# Patient Record
Sex: Male | Born: 1965 | Race: White | Hispanic: No | Marital: Married | Smoking: Never smoker
Health system: Southern US, Community
[De-identification: ages and names within clinical notes are randomized; demographics above are authoritative.]

## PROBLEM LIST (undated history)

## (undated) DIAGNOSIS — E785 Hyperlipidemia, unspecified: Secondary | ICD-10-CM

## (undated) DIAGNOSIS — I10 Essential (primary) hypertension: Secondary | ICD-10-CM

## (undated) HISTORY — DX: Essential (primary) hypertension: I10

## (undated) HISTORY — DX: Hyperlipidemia, unspecified: E78.5

---
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
## 2005-11-09 ENCOUNTER — Ambulatory Visit: Payer: Self-pay | Admitting: Sports Medicine

## 2005-12-17 ENCOUNTER — Ambulatory Visit: Payer: Self-pay | Admitting: Sports Medicine

## 2015-07-31 ENCOUNTER — Encounter: Payer: Self-pay | Admitting: Sports Medicine

## 2015-07-31 ENCOUNTER — Ambulatory Visit (INDEPENDENT_AMBULATORY_CARE_PROVIDER_SITE_OTHER): Payer: BLUE CROSS/BLUE SHIELD | Admitting: Sports Medicine

## 2015-07-31 VITALS — BP 175/89 | HR 76 | Ht 70.0 in | Wt 190.0 lb

## 2015-07-31 DIAGNOSIS — S73192A Other sprain of left hip, initial encounter: Secondary | ICD-10-CM | POA: Diagnosis not present

## 2015-07-31 DIAGNOSIS — M545 Low back pain, unspecified: Secondary | ICD-10-CM | POA: Insufficient documentation

## 2015-07-31 DIAGNOSIS — S76019A Strain of muscle, fascia and tendon of unspecified hip, initial encounter: Secondary | ICD-10-CM | POA: Insufficient documentation

## 2015-07-31 MED ORDER — MELOXICAM 15 MG PO TABS: 15.0000 mg | ORAL_TABLET | Freq: Every day | ORAL | Status: DC

## 2015-07-31 MED ORDER — CYCLOBENZAPRINE HCL 10 MG PO TABS: 10.0000 mg | ORAL_TABLET | Freq: Three times a day (TID) | ORAL | Status: DC | PRN

## 2015-07-31 NOTE — Assessment & Plan Note (Signed)
Givne basic 4 Williams flexion exercises  Icing or heat for comfort  Start flexeril at HS and Meloxicam 15 QD  Red flags advised  Reck if not resolving

## 2015-07-31 NOTE — Assessment & Plan Note (Signed)
Given a print out of hip abduction exercises  Demonstrated use of these  Expect good respone in 6 weeks and gradually RT running  Reck if not

## 2015-07-31 NOTE — Progress Notes (Signed)
Patient ID: Craig Oliver, male   DOB: Jul 08, 1965, 50 y.o.   MRN: [id number]  CC; acute RT LBP/ 2 mos of left hip pain  Patient awakened this morning Has had LBP near RT SIJ No specific injury No signiricant hx of LBP Does not radiate to leg Hurts to sit No bowel or bladder sxs  Left lateral hip pain has started about 2 mos ago He was running This has gotten worse to where he does not want to run Tried different shoes but no real difference No specific injury but was doing fitness exercises as well  Past Hx HTN on lisinopril Chol on lipitor  ROS Denies weakness in lower extremity No sciatica No joint pain or swelling  Gen Looks uncomfortable but NAD BP 175/89 mmHg  Pulse 76  Ht 5\' 10"  (1.778 m)  Wt 190 lb (86.183 kg)  BMI 27.26 kg/m2  Pain over RT SIJ but both are mobile Only minimal pain over lumbar spine Neg SLR Good strength in testing L3 to S 2 Heel, toe and tandem walk with no problem  Left hip shows norm ROM Flex and ext strength is good Hip abduction is weak over glut med TFL is weak Walking gait shifts to left

## 2015-09-11 ENCOUNTER — Encounter: Payer: Self-pay | Admitting: Sports Medicine

## 2015-09-11 ENCOUNTER — Ambulatory Visit (INDEPENDENT_AMBULATORY_CARE_PROVIDER_SITE_OTHER): Payer: BLUE CROSS/BLUE SHIELD | Admitting: Sports Medicine

## 2015-09-11 ENCOUNTER — Encounter (INDEPENDENT_AMBULATORY_CARE_PROVIDER_SITE_OTHER): Payer: Self-pay

## 2015-09-11 VITALS — BP 136/85 | Ht 70.0 in | Wt 190.0 lb

## 2015-09-11 DIAGNOSIS — M545 Low back pain: Secondary | ICD-10-CM

## 2015-09-11 DIAGNOSIS — S73192D Other sprain of left hip, subsequent encounter: Secondary | ICD-10-CM

## 2015-09-11 NOTE — Progress Notes (Signed)
Patient ID: Craig Oliver, male   DOB: January 10, 1966, 50 y.o.   MRN: [id number]  Patient returns for followup of low back pain and hip pain  Within 3 days of starting Flexeril and Mobic he was able to stop medicines for his back pain resolved He began doing the exercises for his left hip and that pain also has steadily lessened He rarely has pain Pain levels now are down to 1-2 He has been continued exercises and even did boot camp this week He is hoping to start back more running  Review of systems Denies cough or sneeze pain Denies any radiation down his leg No weakness in either extremity  Physical examination No acute distress BP 136/85 mmHg  Ht 5\' 10"  (1.778 m)  Wt 190 lb (86.183 kg)  BMI 27.26 kg/m2  Back motion is full in flexion extension lateral bending and rotation No pain with these motions He is fairly tight on forward flexion  Hip joint range of motion is normal laterally Abduction strength now has returned to normal FABER is tight on the right Straight leg raise is tight at 70 on the right and 60 on the left

## 2015-09-11 NOTE — Assessment & Plan Note (Signed)
This has essentially resolved  I think he should continue to do exercises at least 2-3 times a week to help improve his hip and core strength   Okay to ease back into running

## 2015-09-11 NOTE — Assessment & Plan Note (Signed)
He is much improved  Because he said a couple flares of back pain I think he should continue on some Williams flexion exercises  Continue on abdominal strengthening  Recheck when necessary

## 2015-09-11 NOTE — Patient Instructions (Signed)
For your low back: at least a couple times a week, do some knee to chest stretches and some sit-ups or crunches  For your hips:  Try to do standing rotation  For your hamstrings: try hurdler stretch after running and an extender exercise with 2 sets of 10 reps  See handouts  Follow up as needed

## 2015-11-27 ENCOUNTER — Ambulatory Visit (INDEPENDENT_AMBULATORY_CARE_PROVIDER_SITE_OTHER): Payer: BLUE CROSS/BLUE SHIELD | Admitting: Psychology

## 2015-11-27 DIAGNOSIS — F341 Dysthymic disorder: Secondary | ICD-10-CM

## 2015-12-11 ENCOUNTER — Ambulatory Visit (INDEPENDENT_AMBULATORY_CARE_PROVIDER_SITE_OTHER): Payer: BLUE CROSS/BLUE SHIELD | Admitting: Psychology

## 2015-12-11 DIAGNOSIS — F4320 Adjustment disorder, unspecified: Secondary | ICD-10-CM | POA: Diagnosis not present

## 2017-03-25 DIAGNOSIS — J069 Acute upper respiratory infection, unspecified: Secondary | ICD-10-CM | POA: Diagnosis not present

## 2017-12-22 DIAGNOSIS — R7303 Prediabetes: Secondary | ICD-10-CM | POA: Diagnosis not present

## 2017-12-22 DIAGNOSIS — Z0000 Encounter for general adult medical examination without abnormal findings: Secondary | ICD-10-CM | POA: Diagnosis not present

## 2017-12-22 DIAGNOSIS — E785 Hyperlipidemia, unspecified: Secondary | ICD-10-CM | POA: Diagnosis not present

## 2017-12-22 DIAGNOSIS — I10 Essential (primary) hypertension: Secondary | ICD-10-CM | POA: Diagnosis not present

## 2018-01-25 DIAGNOSIS — I10 Essential (primary) hypertension: Secondary | ICD-10-CM | POA: Diagnosis not present

## 2018-01-25 DIAGNOSIS — Z0189 Encounter for other specified special examinations: Secondary | ICD-10-CM | POA: Diagnosis not present

## 2018-01-25 DIAGNOSIS — E782 Mixed hyperlipidemia: Secondary | ICD-10-CM | POA: Diagnosis not present

## 2018-01-25 DIAGNOSIS — R6889 Other general symptoms and signs: Secondary | ICD-10-CM | POA: Diagnosis not present

## 2018-02-09 DIAGNOSIS — Z23 Encounter for immunization: Secondary | ICD-10-CM | POA: Diagnosis not present

## 2018-02-15 DIAGNOSIS — I10 Essential (primary) hypertension: Secondary | ICD-10-CM | POA: Diagnosis not present

## 2018-02-15 DIAGNOSIS — R6889 Other general symptoms and signs: Secondary | ICD-10-CM | POA: Diagnosis not present

## 2018-02-17 DIAGNOSIS — I10 Essential (primary) hypertension: Secondary | ICD-10-CM | POA: Diagnosis not present

## 2018-02-17 DIAGNOSIS — R6889 Other general symptoms and signs: Secondary | ICD-10-CM | POA: Diagnosis not present

## 2018-03-02 DIAGNOSIS — Z0189 Encounter for other specified special examinations: Secondary | ICD-10-CM | POA: Diagnosis not present

## 2018-03-02 DIAGNOSIS — I10 Essential (primary) hypertension: Secondary | ICD-10-CM | POA: Diagnosis not present

## 2018-03-02 DIAGNOSIS — E782 Mixed hyperlipidemia: Secondary | ICD-10-CM | POA: Diagnosis not present

## 2018-03-23 DIAGNOSIS — I10 Essential (primary) hypertension: Secondary | ICD-10-CM | POA: Diagnosis not present

## 2018-03-27 DIAGNOSIS — M25512 Pain in left shoulder: Secondary | ICD-10-CM | POA: Diagnosis not present

## 2018-04-21 ENCOUNTER — Ambulatory Visit
Admission: RE | Admit: 2018-04-21 | Discharge: 2018-04-21 | Disposition: A | Discharge status: Home / Self Care | Payer: BLUE CROSS/BLUE SHIELD | Source: Ambulatory Visit | Attending: Chiropractic Medicine | Admitting: Chiropractic Medicine

## 2018-04-21 ENCOUNTER — Other Ambulatory Visit: Payer: Self-pay | Admitting: Chiropractic Medicine

## 2018-04-21 DIAGNOSIS — M25512 Pain in left shoulder: Secondary | ICD-10-CM

## 2018-04-21 DIAGNOSIS — M25612 Stiffness of left shoulder, not elsewhere classified: Secondary | ICD-10-CM

## 2018-04-21 DIAGNOSIS — M19012 Primary osteoarthritis, left shoulder: Secondary | ICD-10-CM | POA: Diagnosis not present

## 2018-08-31 ENCOUNTER — Encounter: Payer: Self-pay | Admitting: Cardiology

## 2018-08-31 ENCOUNTER — Ambulatory Visit: Payer: BLUE CROSS/BLUE SHIELD | Admitting: Cardiology

## 2018-08-31 ENCOUNTER — Other Ambulatory Visit: Payer: Self-pay

## 2018-08-31 VITALS — BP 149/93 | HR 73 | Ht 70.0 in | Wt 192.0 lb

## 2018-08-31 DIAGNOSIS — I10 Essential (primary) hypertension: Secondary | ICD-10-CM

## 2018-08-31 NOTE — Progress Notes (Signed)
Virtual Visit via Video Note   Subjective:   Craig Oliver, male    DOB: [birth year], 53 y.o.   MRN: [id number]   I connected with the patient on 08/31/18 by a video enabled telemedicine application and verified that I am speaking with the correct person using two identifiers.     I discussed the limitations of evaluation and management by telemedicine and the availability of in person appointments. The patient expressed understanding and agreed to proceed.   This visit type was conducted due to national recommendations for restrictions regarding the COVID-19 Pandemic (e.g. social distancing).  This format is felt to be most appropriate for this patient at this time.  All issues noted in this document were discussed and addressed.  No physical exam was performed (except for noted visual exam findings with Tele health visits).  The patient has consented to conduct a Tele health visit and understands insurance will be billed.     Chief complaint:  Hypertension  HPI  53 y/o Caucasian male with hypertension, normal exercise treadmill stress test, incidental finding of possible PFO.  He has not had any major medical issues other than shoulder pain treated with physical therapy.  He has not been checking his blood pressure regularly.  While it was 130s over 80s last night, it was elevated this morning.  He is compliant with his medical therapy.  However, his exercise activity has reduced during current long-term.  Past Medical History:  Diagnosis Date  . Hyperlipidemia   . Hypertension      Past Surgical History:  Procedure Laterality Date  . FINGER SURGERY Left 1985     Social History   Socioeconomic History  . Marital status: Married    Spouse name: Not on file  . Number of children: 1  . Years of education: Not on file  . Highest education level: Not on file  Occupational History  . Not on file  Social Needs  . Financial resource strain: Not on file  . Food insecurity:     Worry: Not on file    Inability: Not on file  . Transportation needs:    Medical: Not on file    Non-medical: Not on file  Tobacco Use  . Smoking status: Never Smoker  . Smokeless tobacco: Never Used  Substance and Sexual Activity  . Alcohol use: Yes    Alcohol/week: 0.0 standard drinks    Comment: occasionally  . Drug use: Not on file  . Sexual activity: Not on file  Lifestyle  . Physical activity:    Days per week: Not on file    Minutes per session: Not on file  . Stress: Not on file  Relationships  . Social connections:    Talks on phone: Not on file    Gets together: Not on file    Attends religious service: Not on file    Active member of club or organization: Not on file    Attends meetings of clubs or organizations: Not on file    Relationship status: Not on file  . Intimate partner violence:    Fear of current or ex partner: Not on file    Emotionally abused: Not on file    Physically abused: Not on file    Forced sexual activity: Not on file  Other Topics Concern  . Not on file  Social History Narrative  . Not on file     Family History  Problem Relation Age of Onset  . Breast  cancer Mother     Current Outpatient Medications on File Prior to Visit  Medication Sig Dispense Refill  . atorvastatin (LIPITOR) 10 MG tablet daily.    Marland Kitchen docusate sodium (COLACE) 100 MG capsule Take 100 mg by mouth daily.    . folic acid (FOLVITE) 086 MCG tablet Take 800 mcg by mouth daily.    . hydrochlorothiazide (HYDRODIURIL) 25 MG tablet Take 25 mg by mouth daily.    Marland Kitchen losartan (COZAAR) 100 MG tablet Take 100 mg by mouth daily.    . Multiple Vitamins-Minerals (CENTRUM SILVER ADULT 50+ PO) Take by mouth daily.    . Omega-3 Fatty Acids (OMEGA-3 CF PO) Take by mouth daily. 2485mg     No current facility-administered medications on file prior to visit.     Cardiovascular studies:  EKG 01/25/2018: Sinus rhythm 60 bpm. Normal EKG.  Exercise Treadmill Stress Test  02/17/2018:  Indication: Reduced exercise capacity , HTN The patient exercised on Bruce protocol for  11:22 min. Patient achieved  13.48 METS and reached HR  169 bpm, which is  101 % of maximum age-predicted HR.  Stress test terminated due to fatigue.   Exercise capacity was normal. HR Response to Exercise: Appropriate. BP Response to Exercise: Resting hypertension 150/92 mmhg- exaggerated response, peak BP  252/70 mmHg. Chest Pain: none. Arrhythmias: none. Resting EKG demonstrates Normal sinus rhythm. ST Changes: With peak exercise there was no ST-T changes of ischemia.  Overall Impression: Normal stress test.  Echocardiogram 02/15/2018: Left ventricle cavity is normal in size. Mild concentric hypertrophy of the left ventricle. Normal global wall motion. Doppler evidence of grade I (impaired) diastolic dysfunction, normal LAP. Calculated EF 55%. 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 Mild (Grade I) aortic regurgitation. Inadequate tricuspid regurgitation jet to estimate pulmonary artery pressure. Normal right atrial pressure.  Recent labs: 12/22/2017: H/H 17/49. MCV 98. Platelets 193.  Glucose 108, BUN/Cr 17/0.96. eGFR 82/99. Na/K 139/4.7. Rest of the CMP normal. Chol 161, TG 132, HDL 49, LDL 86.  Review of Systems  Constitution: Negative for decreased appetite, malaise/fatigue, weight gain and weight loss.  HENT: Negative for congestion.   Eyes: Negative for visual disturbance.  Cardiovascular: Negative for chest pain, dyspnea on exertion, leg swelling, palpitations and syncope.  Respiratory: Negative for cough.   Endocrine: Negative for cold intolerance.  Hematologic/Lymphatic: Does not bruise/bleed easily.  Skin: Negative for itching and rash.  Musculoskeletal: Negative for myalgias.  Gastrointestinal: Negative for abdominal pain, nausea and vomiting.  Genitourinary: Negative for dysuria.  Neurological: Negative for dizziness and weakness.   Psychiatric/Behavioral: The patient is not nervous/anxious.   All other systems reviewed and are negative.        Vitals:   08/31/18 0947  BP: (!) 149/93  Pulse: 73   (Measured by the patient using a home BP monitor)   Observation/findings during video visit   Objective:    Physical Exam  Constitutional: He is oriented to person, place, and time. He appears well-developed and well-nourished. No distress.  Pulmonary/Chest: Effort normal.  Neurological: He is alert and oriented to person, place, and time.  Psychiatric: He has a normal mood and affect.  Nursing note and vitals reviewed.         Assessment & Recommendations:   53y/o Caucasian male with hypertension, normal exercise treadmill stress test, incidental finding of possiblw PFO.  Hypertension: Suboptimal control today.  Continue losartan 100 mg daily, hydrochlorothiazide 25 mg daily.  I have encouraged the patient to check his blood  pressure regularly.  If persistent elevation greater than 140/80 mmHg, recommend adding amlodipine 5 mg daily.  Recommend regular physical activity and salt restriction.  Continue follow-up with PCP.  I will see him on as-needed basis.  Primary prevention: Continue low dose statin  [name], MD [facility] Pager: [phone] Office: [phone] If no answer Cell [phone]

## 2018-09-25 ENCOUNTER — Telehealth: Payer: Self-pay

## 2018-09-25 NOTE — Telephone Encounter (Signed)
Patient called to say that his BP has been in the 130's over 80's.Marland Kitchen

## 2019-01-02 DIAGNOSIS — Z0000 Encounter for general adult medical examination without abnormal findings: Secondary | ICD-10-CM | POA: Diagnosis not present

## 2019-01-09 DIAGNOSIS — R7303 Prediabetes: Secondary | ICD-10-CM | POA: Diagnosis not present

## 2019-01-09 DIAGNOSIS — Z23 Encounter for immunization: Secondary | ICD-10-CM | POA: Diagnosis not present

## 2019-01-09 DIAGNOSIS — Z125 Encounter for screening for malignant neoplasm of prostate: Secondary | ICD-10-CM | POA: Diagnosis not present

## 2019-01-09 DIAGNOSIS — I10 Essential (primary) hypertension: Secondary | ICD-10-CM | POA: Diagnosis not present

## 2019-01-09 DIAGNOSIS — E785 Hyperlipidemia, unspecified: Secondary | ICD-10-CM | POA: Diagnosis not present

## 2019-06-18 ENCOUNTER — Other Ambulatory Visit: Payer: Self-pay

## 2019-06-18 MED ORDER — HYDROCHLOROTHIAZIDE 25 MG PO TABS: 25.0000 mg | ORAL_TABLET | Freq: Every day | ORAL | 1 refills | Status: DC

## 2019-06-18 MED ORDER — LOSARTAN POTASSIUM 100 MG PO TABS: 100.0000 mg | ORAL_TABLET | Freq: Every day | ORAL | 1 refills | Status: AC

## 2019-06-18 MED ORDER — LOSARTAN POTASSIUM 100 MG PO TABS: 100.0000 mg | ORAL_TABLET | Freq: Every day | ORAL | 1 refills | Status: DC

## 2019-06-21 ENCOUNTER — Ambulatory Visit: Payer: BLUE CROSS/BLUE SHIELD | Admitting: Cardiology

## 2019-06-21 ENCOUNTER — Encounter: Payer: Self-pay | Admitting: Cardiology

## 2019-06-21 ENCOUNTER — Other Ambulatory Visit: Payer: Self-pay

## 2019-06-21 VITALS — BP 153/92 | HR 63 | Ht 70.0 in | Wt 188.4 lb

## 2019-06-21 DIAGNOSIS — I10 Essential (primary) hypertension: Secondary | ICD-10-CM

## 2019-06-21 NOTE — Progress Notes (Signed)
Subjective:   Craig Oliver, male    DOB: 1965-09-11, 54 y.o.   MRN: [id number]   Chief complaint:  Hypertension  HPI  54 y/o Caucasian male with hypertension, normal exercise treadmill stress test, incidental finding of possible PFO.  Blood pressure is 120s-130/70s-80s at home. BP elevated today, comparable on home monitor and office monitor. He denies chest pain, shortness of breath, palpitations, leg edema, orthopnea, PND, TIA/syncope. Recent labs reviewed with the patient.   Current Outpatient Medications on File Prior to Visit  Medication Sig Dispense Refill  . atorvastatin (LIPITOR) 10 MG tablet daily.    Marland Kitchen docusate sodium (COLACE) 100 MG capsule Take 100 mg by mouth daily.    . folic acid (FOLVITE) 253 MCG tablet Take 800 mcg by mouth daily.    . hydrochlorothiazide (HYDRODIURIL) 25 MG tablet Take 1 tablet (25 mg total) by mouth daily. 90 tablet 1  . losartan (COZAAR) 100 MG tablet Take 1 tablet (100 mg total) by mouth daily. 90 tablet 1  . Multiple Vitamins-Minerals (CENTRUM SILVER ADULT 50+ PO) Take by mouth daily.    . Omega-3 Fatty Acids (OMEGA-3 CF PO) Take by mouth daily. 2448mg     No current facility-administered medications on file prior to visit.    Cardiovascular studies:  EKG 06/21/2019: Sinus rhythm 59 bpm. Normal EKG.  Exercise Treadmill Stress Test 02/17/2018:  Indication: Reduced exercise capacity , HTN The patient exercised on Bruce protocol for  11:22 min. Patient achieved  13.48 METS and reached HR  169 bpm, which is  101 % of maximum age-predicted HR.  Stress test terminated due to fatigue.   Exercise capacity was normal. HR Response to Exercise: Appropriate. BP Response to Exercise: Resting hypertension 150/92 mmhg- exaggerated response, peak BP  252/70 mmHg. Chest Pain: none. Arrhythmias: none. Resting EKG demonstrates Normal sinus rhythm. ST Changes: With peak exercise there was no ST-T changes of ischemia.  Overall Impression: Normal  stress test.  Echocardiogram 02/15/2018: Left ventricle cavity is normal in size. Mild concentric hypertrophy of the left ventricle. Normal global wall motion. Doppler evidence of grade I (impaired) diastolic dysfunction, normal LAP. Calculated EF 55%. 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 Mild (Grade I) aortic regurgitation. Inadequate tricuspid regurgitation jet to estimate pulmonary artery pressure. Normal right atrial pressure.  Recent labs: 01/09/2019: Glucose 96, BUN/Cr 18/1.0. HbA1C 5.7% Chol 176, TG 149, HDL 55, LDL 91   12/22/2017: H/H 17/49. MCV 98. Platelets 193.  Glucose 108, BUN/Cr 17/0.96. eGFR 82/99. Na/K 139/4.7. Rest of the CMP normal. Chol 161, TG 132, HDL 49, LDL 86.  Review of Systems  Constitution: Negative for decreased appetite, malaise/fatigue, weight gain and weight loss.  HENT: Negative for congestion.   Eyes: Negative for visual disturbance.  Cardiovascular: Negative for chest pain, dyspnea on exertion, leg swelling, palpitations and syncope.  Respiratory: Negative for cough.   Endocrine: Negative for cold intolerance.  Hematologic/Lymphatic: Does not bruise/bleed easily.  Skin: Negative for itching and rash.  Musculoskeletal: Negative for myalgias.  Gastrointestinal: Negative for abdominal pain, nausea and vomiting.  Genitourinary: Negative for dysuria.  Neurological: Negative for dizziness and weakness.  Psychiatric/Behavioral: The patient is not nervous/anxious.   All other systems reviewed and are negative.        Vitals:   06/21/19 1603 06/21/19 1612  BP: (!) 155/89 (!) 153/92  Pulse: 62 63     Objective:    Physical Exam  Constitutional: He is oriented to person, place, and time. He appears  well-developed and well-nourished. No distress.  Pulmonary/Chest: Effort normal.  Neurological: He is alert and oriented to person, place, and time.  Psychiatric: He has a normal mood and affect.  Nursing note and vitals  reviewed.         Assessment & Recommendations:   54 y/o Caucasian male with hypertension, normal exercise treadmill stress test, incidental finding of possible PFO.  Hypertension: Suspect white coat hypertension. No change made today. Conitnue regular exercise and low salt diet. COnitnue follow up with PCP.  I will see him on as needed basis.  Primary prevention: Continue low dose statin  Craig Mormon, MD [facility] Pager: [phone] Office: [phone] If no answer Cell [phone]

## 2019-07-26 ENCOUNTER — Ambulatory Visit: Payer: BLUE CROSS/BLUE SHIELD | Admitting: Cardiology

## 2020-10-31 ENCOUNTER — Ambulatory Visit: Payer: BLUE CROSS/BLUE SHIELD | Admitting: Cardiology

## 2020-10-31 ENCOUNTER — Encounter: Payer: Self-pay | Admitting: Cardiology

## 2020-10-31 ENCOUNTER — Other Ambulatory Visit: Payer: Self-pay

## 2020-10-31 VITALS — BP 123/71 | HR 63 | Temp 97.5°F | Resp 17 | Ht 70.0 in | Wt 193.0 lb

## 2020-10-31 DIAGNOSIS — I10 Essential (primary) hypertension: Secondary | ICD-10-CM

## 2020-10-31 DIAGNOSIS — Z8249 Family history of ischemic heart disease and other diseases of the circulatory system: Secondary | ICD-10-CM

## 2020-10-31 NOTE — Progress Notes (Signed)
Subjective:   Craig Oliver, male    DOB: [birth year], 55 y.o.   MRN: [id number]   Chief complaint:  Hypertension  HPI  55 y/o Caucasian male with hypertension, hyperlipidemia, possible PFO, recent syncope.  N  Patient is here with his wife today.  Patient recently had a syncope episode.  He just finished his workout 45 minutes prior.  He put a piece of pickles.  Pain is months.  He felt that the intensifies he had gone "the long way".  He started coughing violently.  His wife came to check on him.  As he continued to cough, he suddenly lost consciousness and fell to the floor.  Fortunately, he did not sustain any severe injury.  He came back to baseline in about 1 minute.  He has not had any recurrent episode since then.  He is worried about his cardiac risk given family history of CAD.   Current Outpatient Medications on File Prior to Visit  Medication Sig Dispense Refill   atorvastatin (LIPITOR) 10 MG tablet daily.     docusate sodium (COLACE) 100 MG capsule Take 100 mg by mouth daily.     folic acid (FOLVITE) 412 MCG tablet Take 800 mcg by mouth daily.     hydrochlorothiazide (HYDRODIURIL) 25 MG tablet Take 1 tablet (25 mg total) by mouth daily. 90 tablet 1   losartan (COZAAR) 100 MG tablet Take 1 tablet (100 mg total) by mouth daily. 90 tablet 1   Multiple Vitamins-Minerals (CENTRUM SILVER ADULT 50+ PO) Take by mouth daily.     Omega-3 Fatty Acids (OMEGA-3 CF PO) Take by mouth daily. 2478mg     No current facility-administered medications on file prior to visit.    Cardiovascular studies:  EKG 10/31/2020: Sinus rhythm 58 bpm Normal EKG  Exercise Treadmill Stress Test 02/17/2018:  Indication: Reduced exercise capacity , HTN The patient exercised on Bruce protocol for  11:22 min. Patient achieved  13.48 METS and reached HR  169 bpm, which is  101 % of maximum age-predicted HR.  Stress test terminated due to fatigue.   Exercise capacity was normal. HR Response to  Exercise: Appropriate. BP Response to Exercise: Resting hypertension 150/92 mmhg- exaggerated response, peak BP  252/70 mmHg. Chest Pain: none. Arrhythmias: none. Resting EKG demonstrates Normal sinus rhythm. ST Changes: With peak exercise there was no ST-T changes of ischemia.  Overall Impression: Normal stress test.  Echocardiogram 02/15/2018: Left ventricle cavity is normal in size. Mild concentric hypertrophy of the left ventricle. Normal global wall motion. Doppler evidence of grade I (impaired) diastolic dysfunction, normal LAP. Calculated EF 55%. 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 Mild (Grade I) aortic regurgitation. Inadequate tricuspid regurgitation jet to estimate pulmonary artery pressure. Normal right atrial pressure.  Recent labs: 09/25/2020: Glucose 115, BUN/Cr 21/0.99. EGFR 89. Na/K 140/3.8. Rest of the CMP normal H/H 16/46. MCV 97. Platelets 206 HbA1C 5.9%  01/09/2019: Glucose 96, BUN/Cr 18/1.0. HbA1C 5.7% Chol 176, TG 149, HDL 55, LDL 91   12/22/2017: H/H 17/49. MCV 98. Platelets 193.  Glucose 108, BUN/Cr 17/0.96. eGFR 82/99. Na/K 139/4.7. Rest of the CMP normal. Chol 161, TG 132, HDL 49, LDL 86.  Review of Systems  Cardiovascular:  Positive for syncope. Negative for chest pain, dyspnea on exertion, leg swelling and palpitations.        Vitals:   10/31/20 1259 10/31/20 1300  BP:    Pulse:    Resp:    Temp:    SpO2: 95%  96%   Orthostatic VS for the past 72 hrs (Last 3 readings):  Orthostatic BP Patient Position BP Location Cuff Size Orthostatic Pulse  10/31/20 1300 133/79 Standing Left Arm Large 62  10/31/20 1259 143/76 Sitting Left Arm Large 65  10/31/20 1258 125/80 Supine Left Arm Large 63      Objective:    Physical Exam Vitals and nursing note reviewed.  Constitutional:      General: He is not in acute distress. Neck:     Vascular: No JVD.  Cardiovascular:     Rate and Rhythm: Normal rate and regular rhythm.      Heart sounds: Normal heart sounds. No murmur heard. Pulmonary:     Effort: Pulmonary effort is normal.     Breath sounds: Normal breath sounds. No wheezing or rales.          Assessment & Recommendations:   55 y/o Caucasian male with hypertension, hyperlipidemia, possible PFO, recent syncope.    Syncope: Clearly an episode of vigorous coughing leading to increased intrathoracic pressure and reduced venous return.  I do not think he needs any cardiac testing at this time.  Avoid triggers such as intense pickle Spear.  Hypertension: Controlled  Primary prevention: Continue lovastatin.  In addition, I will obtain CT cardiac scoring.  Follow-up as needed  [name], MD [facility] [place] Pager: [phone] Office: [phone] If no answer Cell [phone]

## 2020-11-19 ENCOUNTER — Other Ambulatory Visit: Payer: Self-pay

## 2020-12-17 ENCOUNTER — Ambulatory Visit
Admission: RE | Admit: 2020-12-17 | Discharge: 2020-12-17 | Disposition: A | Discharge status: Home / Self Care | Payer: No Typology Code available for payment source | Source: Ambulatory Visit | Attending: Cardiology | Admitting: Cardiology

## 2020-12-17 DIAGNOSIS — Z8249 Family history of ischemic heart disease and other diseases of the circulatory system: Secondary | ICD-10-CM

## 2020-12-26 ENCOUNTER — Telehealth: Payer: Self-pay | Admitting: Cardiology

## 2020-12-26 ENCOUNTER — Encounter: Payer: Self-pay | Admitting: Cardiology

## 2020-12-26 DIAGNOSIS — R931 Abnormal findings on diagnostic imaging of heart and coronary circulation: Secondary | ICD-10-CM

## 2020-12-26 MED ORDER — ATORVASTATIN CALCIUM 20 MG PO TABS: 20.0000 mg | ORAL_TABLET | Freq: Every day | ORAL | 2 refills | Status: DC

## 2020-12-26 NOTE — Telephone Encounter (Signed)
Calcium score 52, 68 percentile, none in left main. Discussed findings with the patient.  He does not have any angina or angina: Symptoms at this time.  Previously, underwent exercise treadmill stress test in 2019 with excellent exercise capacity without any ischemic changes. Recommend risk factor modification, and aggressive medical management. Recommend increasing Lipitor to 20 mg daily. Lipid panel in 3 months, followed by office visit.   Elder Negus, MD Pager: [phone] Office: [phone]

## 2021-01-05 ENCOUNTER — Telehealth: Payer: Self-pay

## 2021-01-05 NOTE — Telephone Encounter (Signed)
PT wil go back to 10 mg daily per MP

## 2021-01-05 NOTE — Telephone Encounter (Signed)
Can go to 1/2 tab of 20 mg (10 mg). Keep me posted.  Thanks MJP

## 2021-01-05 NOTE — Telephone Encounter (Signed)
Patient calling and said that he is having muscle pain from new dose of Atorvastatin. He asked if he could do 15 mg for a bit and then go up to 20 mg. He has left over 10 mg pills he could could in half

## 2021-01-09 ENCOUNTER — Telehealth: Payer: Self-pay | Admitting: Cardiology

## 2021-01-09 NOTE — Telephone Encounter (Signed)
Lvm for pt to take 10mg  atorvastatin and to call back

## 2021-01-09 NOTE — Telephone Encounter (Signed)
Patient called on Monday in regards to some symptoms he was feeling; he spoke w/medical assistant but can't remember her name. She told him to take 20 mg instead of 10 mg of his atorvastatin. Patient says he thinks 20 mg is too much (feeling muscle cramps). Patient would like to discuss this with medical assistant, says they can call him on Monday 10/3. [phone].

## 2021-01-29 ENCOUNTER — Telehealth: Payer: Self-pay

## 2021-01-29 NOTE — Telephone Encounter (Signed)
Pt called back with quite a few concerns about his atorvastatin. He was taking 10 mg until it was raised to 20 mg. It was cut back down to 10 mg because the patent started to have muscle cramps. Now he is concerned that that atorvastatin may not be high enough and he is worried that he has to be on a higher dose.He would like for you to call him to discuss this. Please advise.

## 2021-01-29 NOTE — Telephone Encounter (Signed)
Spoke with the patient. He will take 20 mg for now and see how he tolerates.  Elder Negus, MD

## 2021-04-14 ENCOUNTER — Telehealth: Payer: Self-pay

## 2021-04-14 ENCOUNTER — Other Ambulatory Visit: Payer: Self-pay

## 2021-04-14 DIAGNOSIS — I10 Essential (primary) hypertension: Secondary | ICD-10-CM

## 2021-04-18 LAB — LIPID PANEL WITH LDL/HDL RATIO
Cholesterol, Total: 178 mg/dL (ref 100–199)
HDL: 58 mg/dL (ref >39)
LDL Chol Calc (NIH): 98 mg/dL (ref 0–99)
LDL/HDL Ratio: 1.7 ratio (ref 0.0–3.6)
Triglycerides: 126 mg/dL (ref 0–149)
VLDL Cholesterol Cal: 22 mg/dL (ref 5–40)

## 2021-04-30 NOTE — Progress Notes (Signed)
LDL has not changed. Can you check if the patient is taking statin, and if so at what dose?   Thanks MJP

## 2021-05-01 ENCOUNTER — Other Ambulatory Visit: Payer: Self-pay

## 2021-05-01 DIAGNOSIS — R931 Abnormal findings on diagnostic imaging of heart and coronary circulation: Secondary | ICD-10-CM

## 2021-05-01 DIAGNOSIS — Z8249 Family history of ischemic heart disease and other diseases of the circulatory system: Secondary | ICD-10-CM

## 2021-05-01 MED ORDER — EZETIMIBE 10 MG PO TABS: 10.0000 mg | ORAL_TABLET | Freq: Every day | ORAL | 3 refills | Status: DC

## 2021-05-01 NOTE — Progress Notes (Signed)
Called pt to inform him about his lab results. Pt mention he is taking Atorvastatin 20mg  one tab daily since October. Pt mention he was taking 10mg  but since October he has been taking 20mg .

## 2021-05-01 NOTE — Progress Notes (Signed)
Has been done! 

## 2021-05-01 NOTE — Progress Notes (Signed)
Called pt and he agreed to add the Zetia, he also made a 3 to 4 month f/u appt with you

## 2021-05-01 NOTE — Progress Notes (Signed)
Perfect. Please send Zetia 10 mg 90 pillsX2 refills, lipid panel in 3 mnths before his next f/u.  Thanks MJP

## 2021-05-01 NOTE — Progress Notes (Signed)
Please ask if he hs is open to adding Zetia (not a statin) 10 mg in addition to atorvastatin?  Thanks MJP

## 2021-08-05 ENCOUNTER — Encounter: Payer: Self-pay | Admitting: Cardiology

## 2021-08-05 ENCOUNTER — Ambulatory Visit: Payer: 59 | Admitting: Cardiology

## 2021-08-05 VITALS — BP 132/81 | HR 54 | Temp 98.2°F | Resp 17 | Ht 70.0 in | Wt 192.6 lb

## 2021-08-05 DIAGNOSIS — Z8249 Family history of ischemic heart disease and other diseases of the circulatory system: Secondary | ICD-10-CM | POA: Insufficient documentation

## 2021-08-05 DIAGNOSIS — R931 Abnormal findings on diagnostic imaging of heart and coronary circulation: Secondary | ICD-10-CM

## 2021-08-05 DIAGNOSIS — I10 Essential (primary) hypertension: Secondary | ICD-10-CM

## 2021-08-05 NOTE — Progress Notes (Signed)
? ? ? ?Subjective:  ? ?Craig Oliver, male    DOB: 03-Jul-1965, 56 y.o.   MRN: [id number] ? ? ?Chief complaint:  ?Hypertension ? ?HPI ? ?56 y/o Caucasian male with hypertension, hyperlipidemia. ? ?Patient is doing well.  He walks up to 20 miles a week, in addition to working out 3 times a week.  He has no complaints with this level of physical activity. Reviewed recent test results with the patient, details below.  ? ? ?Current Outpatient Medications:  ?  atorvastatin (LIPITOR) 20 MG tablet, Take 1 tablet (20 mg total) by mouth daily., Disp: 90 tablet, Rfl: 2 ?  ezetimibe (ZETIA) 10 MG tablet, Take 1 tablet (10 mg total) by mouth daily., Disp: 90 tablet, Rfl: 3 ?  folic acid (FOLVITE) 174 MCG tablet, Take 800 mcg by mouth daily., Disp: , Rfl:  ?  hydrochlorothiazide (HYDRODIURIL) 25 MG tablet, Take 1 tablet (25 mg total) by mouth daily., Disp: 90 tablet, Rfl: 1 ?  losartan (COZAAR) 100 MG tablet, Take 1 tablet (100 mg total) by mouth daily., Disp: 90 tablet, Rfl: 1 ?  Multiple Vitamins-Minerals (CENTRUM SILVER ADULT 50+ PO), Take by mouth daily., Disp: , Rfl:  ? ?Cardiovascular studies: ? ?EKG 08/05/2021: ?Sinus rhythm 55 bpm ?RSR(V1) -nondiagnostic ? ?CT cardiac scoring 12/17/2020: ?Left Main: 0  ?LAD: 10.4  ?LCx: 30.2  ?RCA: 11.3 ?  ?Total Agatston Score: 52 ?MESA database percentile: 56 ? ?EKG 10/31/2020: ?Sinus rhythm 58 bpm ?Normal EKG ? ?Exercise Treadmill Stress Test 02/17/2018:  ?Indication: Reduced exercise capacity , HTN ?The patient exercised on Bruce protocol for  11:22 min. Patient achieved  13.48 METS and reached HR  169 bpm, which is  101 % of maximum age-predicted HR.  Stress test terminated due to fatigue.  ? ?Exercise capacity was normal. ?HR Response to Exercise: Appropriate. ?BP Response to Exercise: Resting hypertension 150/92 mmhg- exaggerated response, peak BP  252/70 mmHg. ?Chest Pain: none. Arrhythmias: none. ?Resting EKG demonstrates Normal sinus rhythm. ?ST Changes: With peak exercise there was  no ST-T changes of ischemia. ? ?Overall Impression: Normal stress test. ? ?Echocardiogram 02/15/2018: ?Left ventricle cavity is normal in size. Mild concentric hypertrophy of the left ventricle. Normal global wall motion. Doppler evidence of grade I (impaired) diastolic dysfunction, normal LAP. Calculated EF 55%. 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 ?Mild (Grade I) aortic regurgitation. ?Inadequate tricuspid regurgitation jet to estimate pulmonary artery pressure. Normal right atrial pressure. ? ?Recent labs: ?04/17/2021: ?Chol 178, TG 126, HDL 58, LDL 98 ? ?09/25/2020: ?Glucose 115, BUN/Cr 21/0.99. EGFR 89. Na/K 140/3.8. Rest of the CMP normal ?H/H 16/46. MCV 97. Platelets 206 ?HbA1C 5.9% ? ?01/09/2019: ?Glucose 96, BUN/Cr 18/1.0. ?HbA1C 5.7% ?Chol 176, TG 149, HDL 55, LDL 91 ? ? ?12/22/2017: ?H/H 17/49. MCV 98. Platelets 193.  ?Glucose 108, BUN/Cr 17/0.96. eGFR 82/99. Na/K 139/4.7. Rest of the CMP normal. ?Chol 161, TG 132, HDL 49, LDL 86. ? ?Review of Systems  ?Cardiovascular:  Positive for syncope. Negative for chest pain, dyspnea on exertion, leg swelling and palpitations.  ? ?   ? ? ?Vitals:  ? 08/05/21 1029  ?BP: 132/81  ?Pulse: (!) 54  ?Resp: 17  ?Temp: 98.2 ?F (36.8 ?C)  ?SpO2: 97%  ? ? ? ? ?Objective:  ?  ?Physical Exam ?Vitals and nursing note reviewed.  ?Constitutional:   ?   General: He is not in acute distress. ?Neck:  ?   Vascular: No JVD.  ?Cardiovascular:  ?   Rate and Rhythm: Normal  rate and regular rhythm.  ?   Heart sounds: Normal heart sounds. No murmur heard. ?Pulmonary:  ?   Effort: Pulmonary effort is normal.  ?   Breath sounds: Normal breath sounds. No wheezing or rales.  ?Musculoskeletal:  ?   Right lower leg: No edema.  ?   Left lower leg: No edema.  ? ? ? ? ? ?   ?Assessment & Recommendations:  ? ?56 y/o Caucasian male with hypertension, hyperlipidemia, possible PFO, elevated coronary calcium score ? ?Elevated coronary calcium score: ?68th percentile. LDL 98. ?Currently  on Lipitor 20 mg, Zetia 10 mg daily. ?Given his symptoms exercise capacity at home for hyperlipoidemia, have not made any changes today.  He will get his upcoming lipid panel through his PCP in a few months.  I will see him back in 6 months. ? ?Hypertension: ?Controlled ? ?F/u in 6 months ? ?[name], MD ?[facility] ?Pager: [phone] ?Office: [phone] ?If no answer Cell [phone] ?   ? ?

## 2021-08-07 ENCOUNTER — Encounter: Payer: Self-pay | Admitting: Cardiology

## 2021-09-06 ENCOUNTER — Other Ambulatory Visit: Payer: Self-pay | Admitting: Cardiology

## 2021-09-06 DIAGNOSIS — R931 Abnormal findings on diagnostic imaging of heart and coronary circulation: Secondary | ICD-10-CM

## 2022-02-04 ENCOUNTER — Ambulatory Visit: Payer: 59 | Admitting: Cardiology

## 2022-02-04 ENCOUNTER — Encounter: Payer: Self-pay | Admitting: Cardiology

## 2022-02-04 VITALS — BP 138/92 | HR 79 | Temp 98.0°F | Resp 16 | Ht 70.0 in | Wt 190.0 lb

## 2022-02-04 DIAGNOSIS — I10 Essential (primary) hypertension: Secondary | ICD-10-CM

## 2022-02-04 DIAGNOSIS — I517 Cardiomegaly: Secondary | ICD-10-CM

## 2022-02-04 DIAGNOSIS — R931 Abnormal findings on diagnostic imaging of heart and coronary circulation: Secondary | ICD-10-CM

## 2022-02-04 DIAGNOSIS — E782 Mixed hyperlipidemia: Secondary | ICD-10-CM | POA: Insufficient documentation

## 2022-02-04 NOTE — Progress Notes (Addendum)
Subjective:   Vedh Ptacek, male    DOB: 11/16/1965, 56 y.o.   MRN: [id number]   Chief complaint:  Hypertension  HPI  56 y/o Caucasian male with hypertension, hyperlipidemia.  Patient is doing well.  He continues to walk 20-25 miles a week, in addition to working out 3 times a week.  He has no complaints with this level of physical activity. Reviewed recent test results with the patient, details below. He has made changes to his diet to reduce carb intake, since A1C came back at 6.3% in 11/2021. Blood pressure elevate today, but was normal two months ago at PCP visit.    Current Outpatient Medications:    atorvastatin (LIPITOR) 20 MG tablet, TAKE 1 TABLET BY MOUTH EVERY DAY, Disp: 90 tablet, Rfl: 2   ezetimibe (ZETIA) 10 MG tablet, Take 1 tablet (10 mg total) by mouth daily., Disp: 90 tablet, Rfl: 3   folic acid (FOLVITE) 553 MCG tablet, Take 800 mcg by mouth daily., Disp: , Rfl:    hydrochlorothiazide (HYDRODIURIL) 25 MG tablet, Take 1 tablet (25 mg total) by mouth daily., Disp: 90 tablet, Rfl: 1   losartan (COZAAR) 100 MG tablet, Take 1 tablet (100 mg total) by mouth daily., Disp: 90 tablet, Rfl: 1   Multiple Vitamins-Minerals (CENTRUM SILVER ADULT 50+ PO), Take by mouth daily., Disp: , Rfl:    Omega-3 Fatty Acids (FISH OIL) 1000 MG CAPS, Take by mouth., Disp: , Rfl:   Cardiovascular studies:  EKG 02/04/2022: Sinus rhythm 75 bpm  RSR(V1) -nondiagnostic Left atrial enlargement (new since 08/05/2021)  CT cardiac scoring 12/17/2020: Left Main: 0  LAD: 10.4  LCx: 30.2  RCA: 11.3   Total Agatston Score: 52 MESA database percentile: 68  Exercise Treadmill Stress Test 02/17/2018:  Indication: Reduced exercise capacity , HTN The patient exercised on Bruce protocol for  11:22 min. Patient achieved  13.48 METS and reached HR  169 bpm, which is  101 % of maximum age-predicted HR.  Stress test terminated due to fatigue.   Exercise capacity was normal. HR Response to Exercise:  Appropriate. BP Response to Exercise: Resting hypertension 150/92 mmhg- exaggerated response, peak BP  252/70 mmHg. Chest Pain: none. Arrhythmias: none. Resting EKG demonstrates Normal sinus rhythm. ST Changes: With peak exercise there was no ST-T changes of ischemia.  Overall Impression: Normal stress test.  Echocardiogram 02/15/2018: Left ventricle cavity is normal in size. Mild concentric hypertrophy of the left ventricle. Normal global wall motion. Doppler evidence of grade I (impaired) diastolic dysfunction, normal LAP. Calculated EF 55%. 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 Mild (Grade I) aortic regurgitation. Inadequate tricuspid regurgitation jet to estimate pulmonary artery pressure. Normal right atrial pressure.  Recent labs: 12/04/2021: Glucose 134, BUN/Cr 18/1.0. EGFR 80. K 4.7. HbA1C 6.3% Chol 130, TG 141, HDL 58, LDL 48  04/17/2021: Chol 178, TG 126, HDL 58, LDL 98  09/25/2020: Glucose 115, BUN/Cr 21/0.99. EGFR 89. Na/K 140/3.8. Rest of the CMP normal H/H 16/46. MCV 97. Platelets 206 HbA1C 5.9%  01/09/2019: Glucose 96, BUN/Cr 18/1.0. HbA1C 5.7% Chol 176, TG 149, HDL 55, LDL 91   12/22/2017: H/H 17/49. MCV 98. Platelets 193.  Glucose 108, BUN/Cr 17/0.96. eGFR 82/99. Na/K 139/4.7. Rest of the CMP normal. Chol 161, TG 132, HDL 49, LDL 86.  Review of Systems  Cardiovascular:  Positive for syncope. Negative for chest pain, dyspnea on exertion, leg swelling and palpitations.         Vitals:   02/04/22 0941 02/04/22  1002  BP: (!) 150/88 (!) 138/92  Pulse: 74 79  Resp:    Temp:    SpO2:        Objective:    Physical Exam Vitals and nursing note reviewed.  Constitutional:      General: He is not in acute distress. Neck:     Vascular: No JVD.  Cardiovascular:     Rate and Rhythm: Normal rate and regular rhythm.     Heart sounds: Normal heart sounds. No murmur heard. Pulmonary:     Effort: Pulmonary effort is normal.     Breath  sounds: Normal breath sounds. No wheezing or rales.  Musculoskeletal:     Right lower leg: No edema.     Left lower leg: No edema.           Assessment & Recommendations:   56 y/o Caucasian male with hypertension, hyperlipidemia, possible PFO, elevated coronary calcium score  Elevated coronary calcium score: 68th percentile. LDL down to 48. Continue Lipitor 20 mg, Zetia 10 mg daily. (Did not tolerate higher dose of statins due to myalgia).  Hypertension: Blood pressure elevated today. Reportedly controlled previously. Keep a log at home. If SBP remains >140 mmHg, will add amlodipine.   Prediabetes: Discussed low carb diet, intermittent fasting. He will repeat A1C with PCP in 3 months.  Left atrial enlargement: Noted on EKG. Will corroborate with EKG.  No Afib alert on Apple watch  F/u in 6 months   [name], MD Pager: [phone] Office: [phone]

## 2022-02-09 ENCOUNTER — Ambulatory Visit: Payer: 59

## 2022-02-09 DIAGNOSIS — I517 Cardiomegaly: Secondary | ICD-10-CM

## 2022-02-09 DIAGNOSIS — I351 Nonrheumatic aortic (valve) insufficiency: Secondary | ICD-10-CM

## 2022-02-11 ENCOUNTER — Encounter: Payer: Self-pay | Admitting: Cardiology

## 2022-02-11 NOTE — Telephone Encounter (Signed)
From patient.

## 2022-04-25 ENCOUNTER — Other Ambulatory Visit: Payer: Self-pay | Admitting: Cardiology

## 2022-05-31 ENCOUNTER — Other Ambulatory Visit: Payer: Self-pay | Admitting: Cardiology

## 2022-05-31 DIAGNOSIS — R931 Abnormal findings on diagnostic imaging of heart and coronary circulation: Secondary | ICD-10-CM

## 2022-07-31 IMAGING — CT CT CARDIAC CORONARY ARTERY CALCIUM SCORE
3 series · 14 of 20 positions shown, 16 images · non-contrast
Comparison: None.

CLINICAL DATA: 55-year-old Caucasian male with history of
hyperlipidemia, hypertension and family history of heart disease.

EXAM:
CT CARDIAC CORONARY ARTERY CALCIUM SCORE
TECHNIQUE: Non-contrast imaging through the heart was performed using
prospective ECG gating. Image post processing was performed on an
independent workstation, allowing for quantitative analysis of the
heart and coronary arteries. Note that this exam targets the heart
and the chest was not imaged in its entirety.

[Series 2: calcium scoring 2.00 qr36 bestdiast 69% hrt calciu · axial · 0.41mm/px · z∈[+1497,+1575]mm · 4 of 66 slices shown]
[im 14/66  vessel]
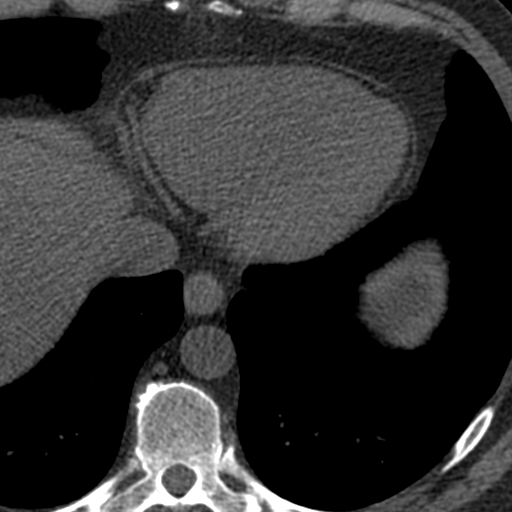
[im 27/66  vessel]
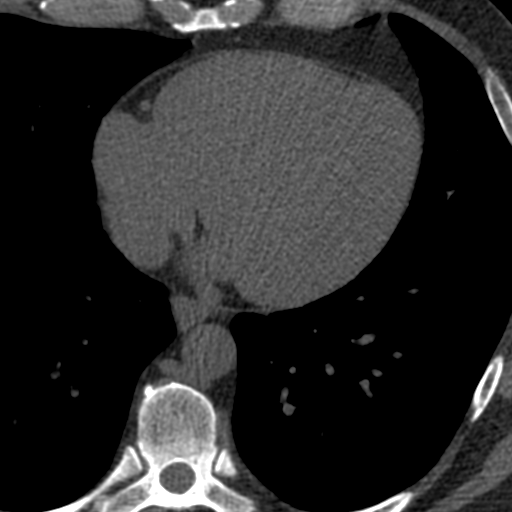
[im 40/66  vessel]
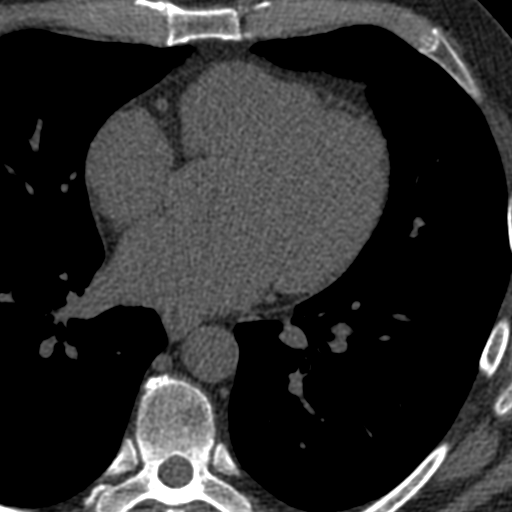
[im 53/66  vessel]
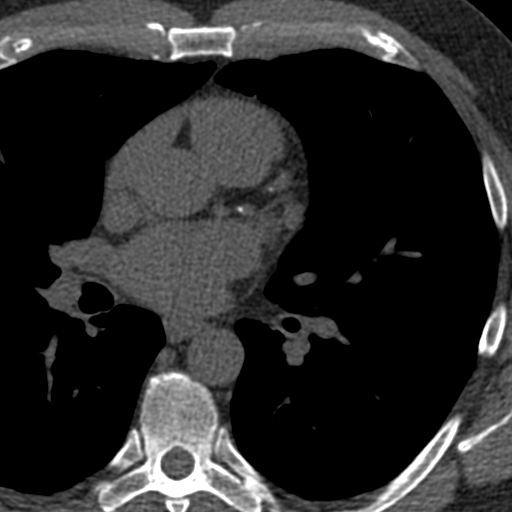

[Series 3: calcium scoring 2.00 br40 bestdiast 69% axial · axial · 0.60mm/px · z∈[+1491,+1579]mm · 5 of 66 slices shown, 7 images]
[im 11/66  vessel]
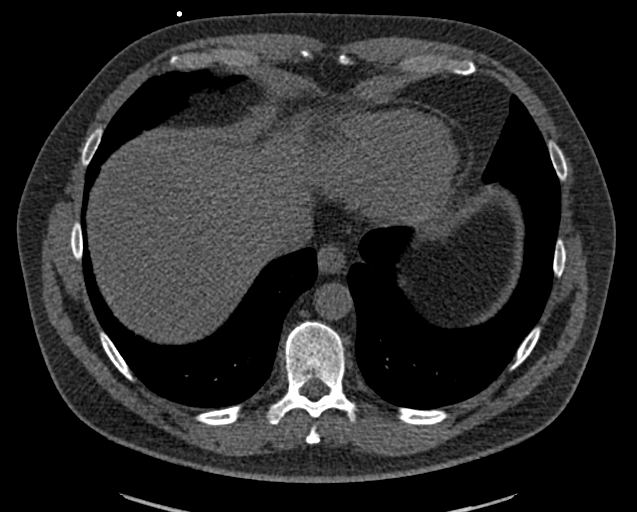
[im 11/66  lung]
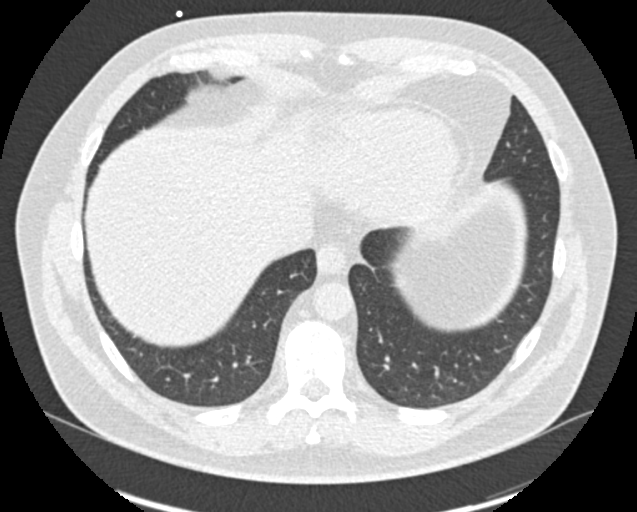
[im 22/66  vessel]
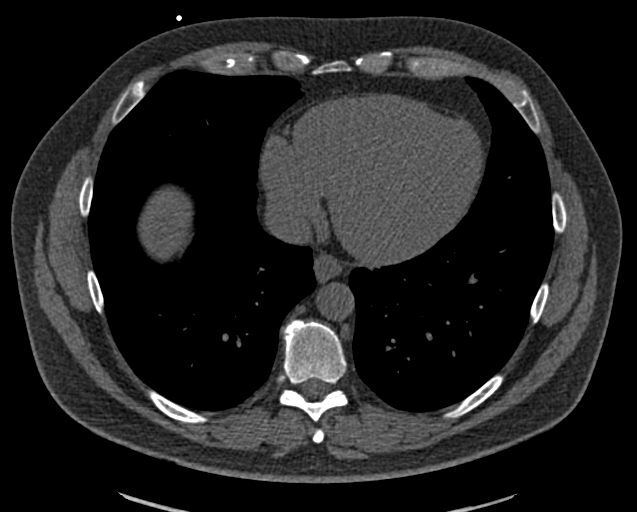
[im 33/66  vessel]
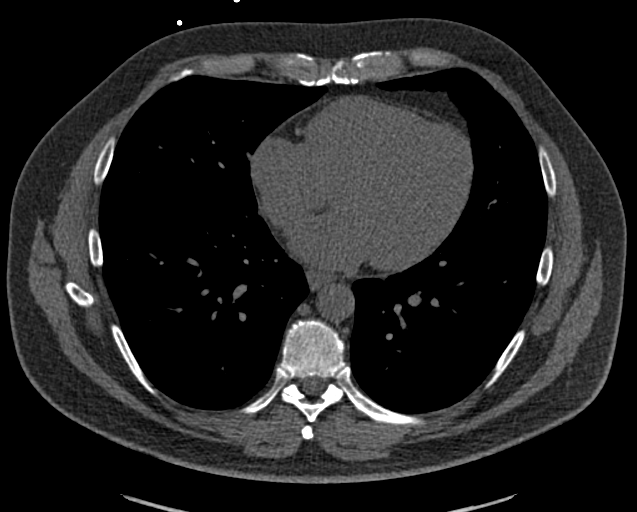
[im 44/66  vessel]
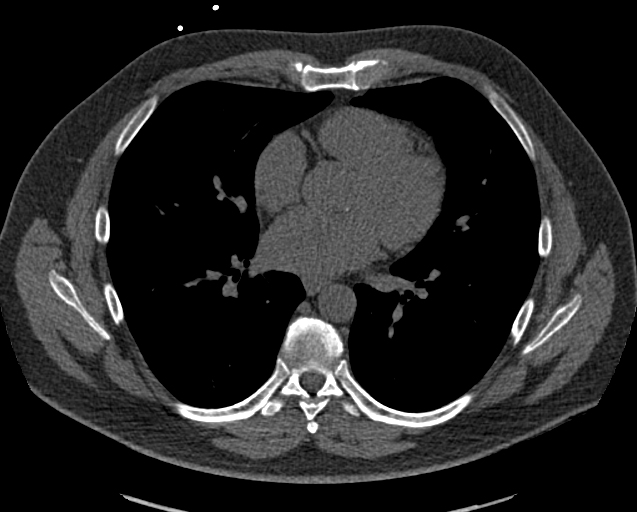
[im 55/66  vessel]
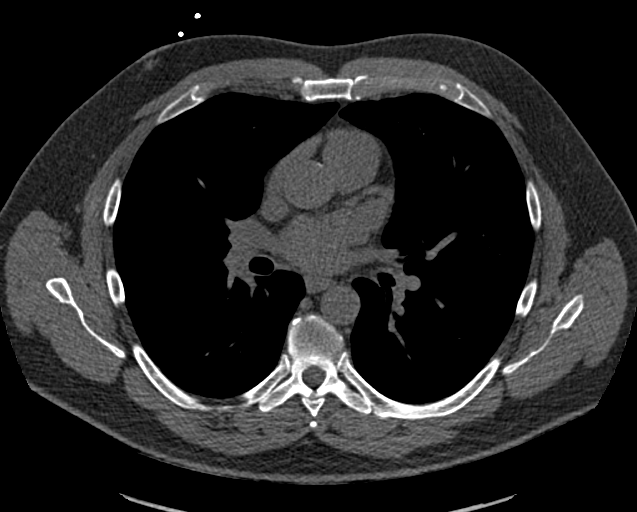
[im 55/66  lung]
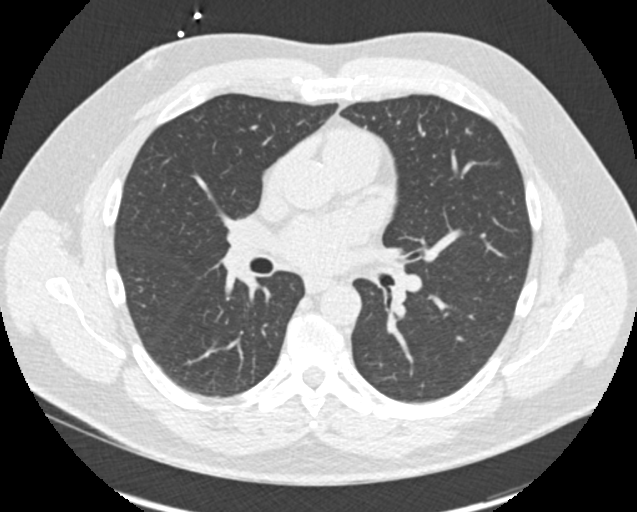

[Series 9: calcium scoring 2.00 br60 bestdiast 69% lungs · axial · 0.60mm/px · z∈[+1491,+1579]mm · 5 of 66 slices shown]
[im 11/66  vessel]
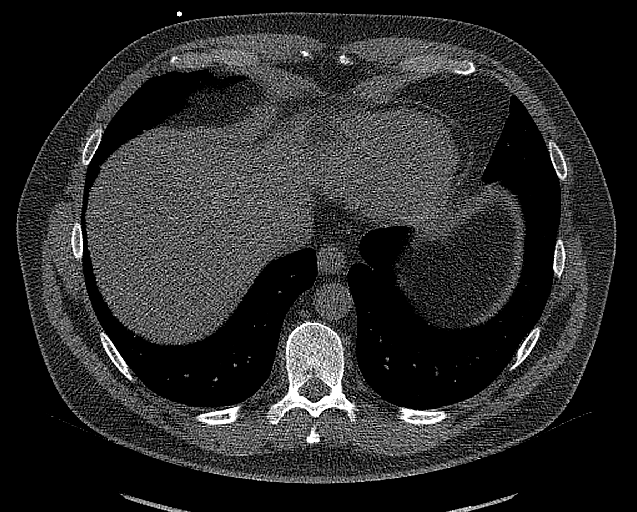
[im 22/66  vessel]
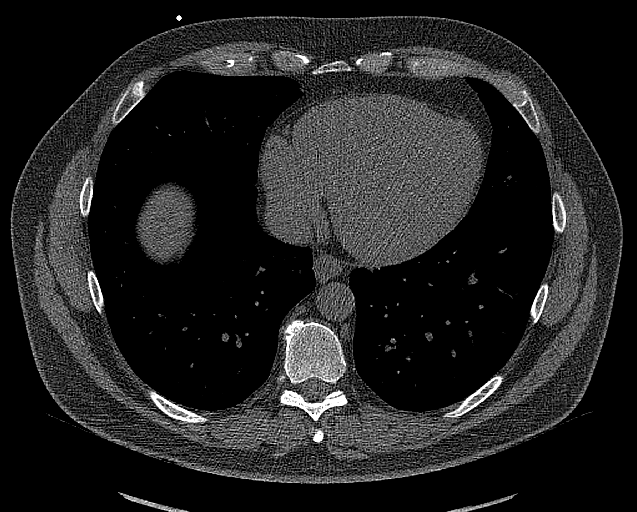
[im 33/66  vessel]
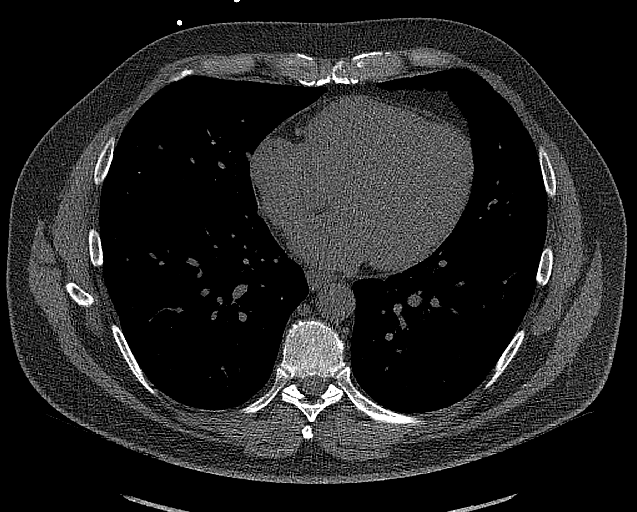
[im 44/66  vessel]
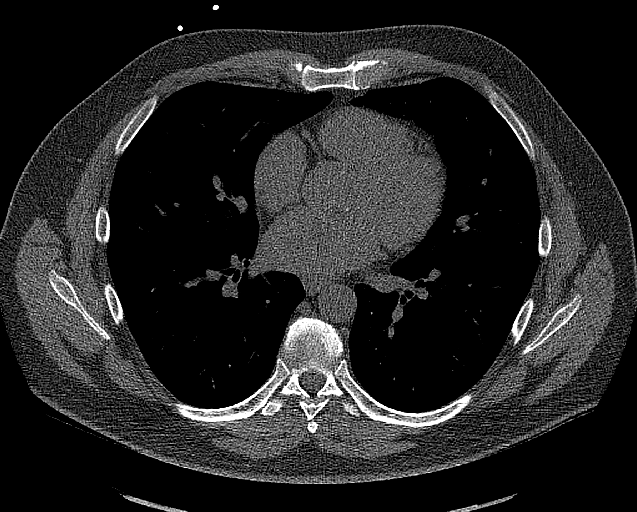
[im 55/66  vessel]
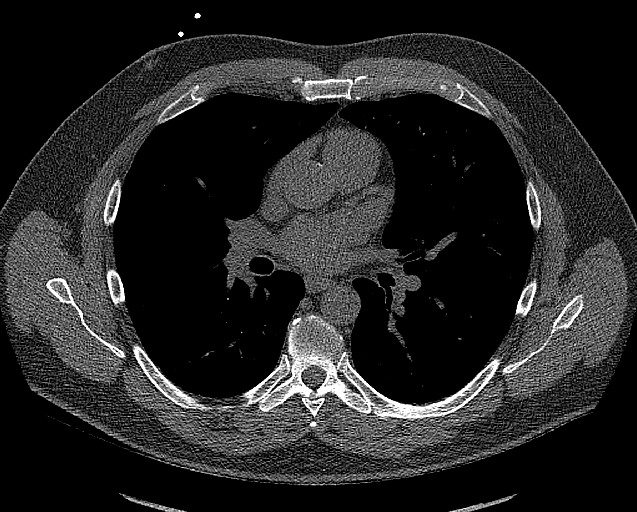

[14 of 20 positions shown; findings below may reference images not displayed]

FINDINGS: CORONARY CALCIUM SCORES:

Left Main: 0

LAD:

LCx:

RCA:

Total Agatston Score: 52

[HOSPITAL] percentile: 68

AORTA MEASUREMENTS:

Ascending Aorta: 29 mm

Descending Aorta: 23 mm

OTHER FINDINGS:

The heart size is within normal limits. No pericardial fluid is
identified. Visualized segments of the thoracic aorta and central
pulmonary arteries are normal in caliber. Visualized mediastinum and
hilar regions demonstrate no lymphadenopathy or masses. Visualized
lungs show no evidence of pulmonary edema, consolidation,
pneumothorax, nodule or pleural fluid. Visualized upper abdomen and
bony structures are unremarkable.
IMPRESSION: Coronary calcium score of 52 is at the 68th percentile for the
patient's age, sex and race.

## 2022-08-06 ENCOUNTER — Ambulatory Visit: Payer: No Typology Code available for payment source | Admitting: Cardiology

## 2022-08-06 ENCOUNTER — Encounter: Payer: Self-pay | Admitting: Cardiology

## 2022-08-06 VITALS — BP 137/82 | HR 67 | Resp 16 | Ht 70.0 in | Wt 188.0 lb

## 2022-08-06 DIAGNOSIS — R931 Abnormal findings on diagnostic imaging of heart and coronary circulation: Secondary | ICD-10-CM

## 2022-08-06 DIAGNOSIS — I10 Essential (primary) hypertension: Secondary | ICD-10-CM

## 2022-08-06 DIAGNOSIS — E782 Mixed hyperlipidemia: Secondary | ICD-10-CM

## 2022-08-06 MED ORDER — HYDROCHLOROTHIAZIDE 25 MG PO TABS: 25.0000 mg | ORAL_TABLET | Freq: Every day | ORAL | 1 refills | Status: AC

## 2022-08-06 MED ORDER — EZETIMIBE 10 MG PO TABS: 10.0000 mg | ORAL_TABLET | Freq: Every day | ORAL | 3 refills | Status: DC

## 2022-08-06 NOTE — Progress Notes (Unsigned)
Subjective:   Craig Oliver, male    DOB: 1965/09/22, 57 y.o.   MRN: [id number]   Chief complaint:  Hypertension  HPI  57 y/o Caucasian male with hypertension, hyperlipidemia.  Patient is doing well.  He continues to walk 20-25 miles a week, in addition to working out 3 times a week.  He has no complaints with this level of physical activity. Reviewed recent test results with the patient, details below. He has made changes to his diet to reduce carb intake, since A1C came back at 6.3% in 11/2021. Blood pressure elevate today, but was normal two months ago at PCP visit.    Current Outpatient Medications:    atorvastatin (LIPITOR) 20 MG tablet, TAKE 1 TABLET BY MOUTH EVERY DAY, Disp: 90 tablet, Rfl: 2   ezetimibe (ZETIA) 10 MG tablet, TAKE 1 TABLET BY MOUTH EVERY DAY, Disp: 90 tablet, Rfl: 3   folic acid (FOLVITE) 800 MCG tablet, Take 800 mcg by mouth daily., Disp: , Rfl:    hydrochlorothiazide (HYDRODIURIL) 25 MG tablet, Take 1 tablet (25 mg total) by mouth daily., Disp: 90 tablet, Rfl: 1   losartan (COZAAR) 100 MG tablet, Take 1 tablet (100 mg total) by mouth daily., Disp: 90 tablet, Rfl: 1   Multiple Vitamins-Minerals (CENTRUM SILVER ADULT 50+ PO), Take by mouth daily., Disp: , Rfl:    Omega-3 Fatty Acids (FISH OIL) 1000 MG CAPS, Take by mouth., Disp: , Rfl:   Cardiovascular studies:  EKG 08/06/2022: Sinus rhythm 65 bpm  Normal EKG  Echocardiogram 02/09/2022: Left ventricle cavity is normal in size and wall thickness. Normal global wall motion. Normal LV systolic function with EF 64%. Normal diastolic filling pattern. Aneurysmal interatrial septum without 2D or color Doppler evidence of shunting. Structurally normal trileaflet aortic valve.  Mild (Grade I) aortic regurgitation. Normal right atrial pressure. No significant change compared to previous study in 2019.  CT cardiac scoring 12/17/2020: Left Main: 0  LAD: 10.4  LCx: 30.2  RCA: 11.3   Total Agatston Score:  52 MESA database percentile: 68  Exercise Treadmill Stress Test 02/17/2018:  Indication: Reduced exercise capacity , HTN The patient exercised on Bruce protocol for  11:22 min. Patient achieved  13.48 METS and reached HR  169 bpm, which is  101 % of maximum age-predicted HR.  Stress test terminated due to fatigue.   Exercise capacity was normal. HR Response to Exercise: Appropriate. BP Response to Exercise: Resting hypertension 150/92 mmhg- exaggerated response, peak BP  252/70 mmHg. Chest Pain: none. Arrhythmias: none. Resting EKG demonstrates Normal sinus rhythm. ST Changes: With peak exercise there was no ST-T changes of ischemia.  Overall Impression: Normal stress test.  Recent labs: 06/09/2022: Glucose 134, BUN/Cr 19/0.9. EGFR 98. K 4.1 Hb 16.2  12/04/2021: Glucose 134, BUN/Cr 18/1.0. EGFR 80. K 4.7. HbA1C 6.3% Chol 130, TG 141, HDL 58, LDL 48  04/17/2021: Chol 178, TG 126, HDL 58, LDL 98  09/25/2020: Glucose 115, BUN/Cr 21/0.99. EGFR 89. Na/K 140/3.8. Rest of the CMP normal H/H 16/46. MCV 97. Platelets 206 HbA1C 5.9%  01/09/2019: Glucose 96, BUN/Cr 18/1.0. HbA1C 5.7% Chol 176, TG 149, HDL 55, LDL 91   12/22/2017: H/H 17/49. MCV 98. Platelets 193.  Glucose 108, BUN/Cr 17/0.96. eGFR 82/99. Na/K 139/4.7. Rest of the CMP normal. Chol 161, TG 132, HDL 49, LDL 86.  Review of Systems  Cardiovascular:  Positive for syncope. Negative for chest pain, dyspnea on exertion, leg swelling and palpitations.  Vitals:   08/06/22 1042  BP: 137/82  Pulse: 67  Resp: 16  SpO2: 98%       Objective:    Physical Exam Vitals and nursing note reviewed.  Constitutional:      General: He is not in acute distress. Neck:     Vascular: No JVD.  Cardiovascular:     Rate and Rhythm: Normal rate and regular rhythm.     Heart sounds: Normal heart sounds. No murmur heard. Pulmonary:     Effort: Pulmonary effort is normal.     Breath sounds: Normal breath sounds. No  wheezing or rales.  Musculoskeletal:     Right lower leg: No edema.     Left lower leg: No edema.           Assessment & Recommendations:   57 y/o Caucasian male with hypertension, hyperlipidemia, possible PFO, elevated coronary calcium score  Elevated coronary calcium score: 68th percentile.  Chol 130, TG 141, HDL 58, LDL 48 (11/2021). Continue Lipitor 20 mg, Zetia 10 mg daily. (Did not tolerate higher dose of statins due to myalgia).  Hypertension: Blood pressure elevated today. Reportedly controlled previously. Keep a log at home. If SBP remains >140 mmHg, will add amlodipine.   Prediabetes: Discussed low carb diet, intermittent fasting. He will repeat A1C with PCP in 3 months.  Left atrial enlargement: Noted on EKG. Will corroborate with EKG.  No Afib alert on Apple watch  F/u in 6 months   Elder Negus, MD Pager: [phone] Office: [phone]

## 2022-08-07 ENCOUNTER — Encounter: Payer: Self-pay | Admitting: Cardiology

## 2022-11-24 NOTE — Telephone Encounter (Signed)
No action done

## 2023-07-13 ENCOUNTER — Encounter: Payer: Self-pay | Admitting: Cardiology

## 2023-07-13 NOTE — Telephone Encounter (Signed)
 Appointment scheduled May 13th at 11 AM

## 2023-08-04 ENCOUNTER — Ambulatory Visit: Payer: Self-pay | Admitting: Cardiology

## 2023-08-22 ENCOUNTER — Telehealth: Payer: Self-pay | Admitting: Cardiology

## 2023-08-22 MED ORDER — EZETIMIBE 10 MG PO TABS: 10.0000 mg | ORAL_TABLET | Freq: Every day | ORAL | 0 refills | Status: AC

## 2023-08-22 NOTE — Telephone Encounter (Signed)
 Pt's medication was sent to pt's pharmacy as requested. Confirmation received.

## 2023-08-22 NOTE — Telephone Encounter (Signed)
*  STAT* If patient is at the pharmacy, call can be transferred to refill team.   1. Which medications need to be refilled? (please list name of each medication and dose if known)   ezetimibe  (ZETIA ) 10 MG tablet    2. Which pharmacy/location (including street and city if local pharmacy) is medication to be sent to? [id number] [place], [place] - [address] Phone: [phone]  Fax: [phone]     3. Do they need a 30 day or 90 day supply? 90

## 2023-08-23 ENCOUNTER — Ambulatory Visit: Payer: Self-pay | Admitting: Cardiology

## 2023-09-26 ENCOUNTER — Ambulatory Visit: Payer: Self-pay | Admitting: Cardiology

## 2023-10-09 ENCOUNTER — Other Ambulatory Visit: Payer: Self-pay | Admitting: Cardiology

## 2023-10-17 ENCOUNTER — Ambulatory Visit: Payer: Self-pay | Attending: Cardiology | Admitting: Cardiology

## 2023-10-17 ENCOUNTER — Encounter: Payer: Self-pay | Admitting: Cardiology

## 2023-10-17 VITALS — BP 110/74 | HR 72 | Ht 70.0 in | Wt 196.6 lb

## 2023-10-17 DIAGNOSIS — I10 Essential (primary) hypertension: Secondary | ICD-10-CM | POA: Diagnosis not present

## 2023-10-17 DIAGNOSIS — E782 Mixed hyperlipidemia: Secondary | ICD-10-CM | POA: Diagnosis not present

## 2023-10-17 DIAGNOSIS — R931 Abnormal findings on diagnostic imaging of heart and coronary circulation: Secondary | ICD-10-CM | POA: Diagnosis not present

## 2023-10-17 NOTE — Patient Instructions (Addendum)
 Follow-Up: At [facility], you and your health needs are our priority.  As part of our continuing mission to provide you with exceptional heart care, our providers are all part of one team.  This team includes your primary Cardiologist (physician) and Advanced Practice Providers or APPs (Physician Assistants and Nurse Practitioners) who all work together to provide you with the care you need, when you need it.  Your next appointment:   AS NEEDED   Provider:   Cody Das, MD

## 2023-10-17 NOTE — Progress Notes (Signed)
 Cardiology Office Note:  .   Date:  10/17/2023  ID:  Redell Medicine, DOB Dec 07, 1965, MRN [id number] PCP: Marvene Prentice SAUNDERS, FNP  [facility] Providers Cardiologist:  Newman Lawrence, MD PCP: Marvene Prentice SAUNDERS, FNP  Chief Complaint  Patient presents with   Hypertension     Taylon Coole is a 58 y.o. male with hypertension, hyperlipidemia, possible PFO, elevated coronary calcium  score   Discussed the use of AI scribe software for clinical note transcription with the patient, who gave verbal consent to proceed.  History of Present Illness  Patient is doing well, walks 2 miles regularly without any complains of chest pain or shortness of breath.  Blood pressure is well-controlled.  Reviewed recent lab results from 11/2022 with the patient, details below.  He has not had any recent syncopal episodes.   Vitals:   10/17/23 1437  BP: 110/74  Pulse: 72  SpO2: 97%      Review of Systems  Cardiovascular:  Negative for chest pain, dyspnea on exertion, leg swelling, palpitations and syncope.        Studies Reviewed: SABRA        EKG 10/17/2023: Normal sinus rhythm Left axis deviation No previous ECGs available    Labs 11/2022: Chol 123, TG 90, HDL 48, LDL 58 HbA1C 6% Hb 16.2 TSH 1.1  Echocardiogram 02/09/2022: Left ventricle cavity is normal in size and wall thickness. Normal global wall motion. Normal LV systolic function with EF 64%. Normal diastolic filling pattern. Aneurysmal interatrial septum without 2D or color Doppler evidence of shunting. Structurally normal trileaflet aortic valve.  Mild (Grade I) aortic regurgitation. Normal right atrial pressure. No significant change compared to previous study in 2019.   CT cardiac scoring 12/17/2020: Left Main: 0  LAD: 10.4  LCx: 30.2  RCA: 11.3   Total Agatston Score: 52 MESA database percentile: 68   Exercise Treadmill Stress Test 02/17/2018:  Indication: Reduced exercise capacity , HTN The patient exercised  on Bruce protocol for  11:22 min. Patient achieved  13.48 METS and reached HR  169 bpm, which is  101 % of maximum age-predicted HR.  Stress test terminated due to fatigue.    Exercise capacity was normal. HR Response to Exercise: Appropriate. BP Response to Exercise: Resting hypertension 150/92 mmhg- exaggerated response, peak BP  252/70 mmHg. Chest Pain: none. Arrhythmias: none. Resting EKG demonstrates Normal sinus rhythm. ST Changes: With peak exercise there was no ST-T changes of ischemia.   Overall Impression: Normal stress test.     Physical Exam Vitals and nursing note reviewed.  Constitutional:      General: He is not in acute distress. Neck:     Vascular: No JVD.  Cardiovascular:     Rate and Rhythm: Normal rate and regular rhythm.     Heart sounds: Normal heart sounds. No murmur heard. Pulmonary:     Effort: Pulmonary effort is normal.     Breath sounds: Normal breath sounds. No wheezing or rales.  Musculoskeletal:     Right lower leg: No edema.     Left lower leg: No edema.      VISIT DIAGNOSES:   ICD-10-CM   1. Essential hypertension  I10 EKG 12-Lead    2. Mixed hyperlipidemia  E78.2 EKG 12-Lead    3. Elevated coronary artery calcium  score  R93.1        Daisuke Bailey is a 58 y.o. male with hypertension, hyperlipidemia, possible PFO, elevated coronary calcium  score  Assessment & Plan  Elevated coronary  calcium  score: 68th percentile.  LDL 58 on Lipitor 20 mg, Zetia  10 mg daily. (Did not tolerate higher dose of statins due to myalgia). Continue the same.   Hypertension: Well-controlled on losartan  and hydrochlorothiazide .      F/u as needed  Signed, Newman JINNY Lawrence, MD
